# Patient Record
Sex: Male | Born: 1980 | Race: White | Hispanic: No | Marital: Married | Smoking: Never smoker
Health system: Southern US, Community
[De-identification: ages and names within clinical notes are randomized; demographics above are authoritative.]

## PROBLEM LIST (undated history)

## (undated) ENCOUNTER — Ambulatory Visit: Admission: EM | Payer: Self-pay | Source: Home / Self Care

## (undated) DIAGNOSIS — R7989 Other specified abnormal findings of blood chemistry: Secondary | ICD-10-CM

## (undated) HISTORY — DX: Other specified abnormal findings of blood chemistry: R79.89

---

## 2011-05-08 DIAGNOSIS — K219 Gastro-esophageal reflux disease without esophagitis: Secondary | ICD-10-CM | POA: Insufficient documentation

## 2012-04-09 DIAGNOSIS — E785 Hyperlipidemia, unspecified: Secondary | ICD-10-CM | POA: Insufficient documentation

## 2014-01-06 DIAGNOSIS — F988 Other specified behavioral and emotional disorders with onset usually occurring in childhood and adolescence: Secondary | ICD-10-CM | POA: Insufficient documentation

## 2019-08-10 ENCOUNTER — Encounter (HOSPITAL_BASED_OUTPATIENT_CLINIC_OR_DEPARTMENT_OTHER): Payer: Self-pay

## 2019-08-10 ENCOUNTER — Emergency Department (HOSPITAL_BASED_OUTPATIENT_CLINIC_OR_DEPARTMENT_OTHER): Payer: No Typology Code available for payment source

## 2019-08-10 ENCOUNTER — Other Ambulatory Visit: Payer: Self-pay

## 2019-08-10 ENCOUNTER — Emergency Department (HOSPITAL_BASED_OUTPATIENT_CLINIC_OR_DEPARTMENT_OTHER)
Admission: EM | Admit: 2019-08-10 | Discharge: 2019-08-10 | Disposition: A | Discharge status: Home / Self Care | Payer: No Typology Code available for payment source | Attending: Emergency Medicine | Admitting: Emergency Medicine

## 2019-08-10 DIAGNOSIS — Y9366 Activity, soccer: Secondary | ICD-10-CM | POA: Diagnosis not present

## 2019-08-10 DIAGNOSIS — X509XXA Other and unspecified overexertion or strenuous movements or postures, initial encounter: Secondary | ICD-10-CM | POA: Insufficient documentation

## 2019-08-10 DIAGNOSIS — S93401A Sprain of unspecified ligament of right ankle, initial encounter: Secondary | ICD-10-CM | POA: Insufficient documentation

## 2019-08-10 DIAGNOSIS — Y999 Unspecified external cause status: Secondary | ICD-10-CM | POA: Insufficient documentation

## 2019-08-10 DIAGNOSIS — Y92322 Soccer field as the place of occurrence of the external cause: Secondary | ICD-10-CM | POA: Insufficient documentation

## 2019-08-10 DIAGNOSIS — S93491A Sprain of other ligament of right ankle, initial encounter: Secondary | ICD-10-CM

## 2019-08-10 DIAGNOSIS — S99911A Unspecified injury of right ankle, initial encounter: Secondary | ICD-10-CM | POA: Diagnosis present

## 2019-08-10 MED ORDER — IBUPROFEN 800 MG PO TABS
800.0000 mg | ORAL_TABLET | Freq: Three times a day (TID) | ORAL | 0 refills | Status: DC
Start: 2019-08-10 — End: 2023-01-29

## 2019-08-10 NOTE — ED Provider Notes (Signed)
[facility] DEPARTMENT Provider Note   CSN: [id number] Arrival date & time: 08/10/19  1141     History Chief Complaint  Patient presents with  . Ankle Injury    Samuel Fernandez is a 39 y.o. male.  39yo M who p/w right ankle pain.  Just prior to arrival, the patient was playing soccer when he rolled his right ankle and felt a pop and immediate onset of lateral ankle pain.  He has also noticed swelling.  He denies any numbness or other injuries.  No previous injury to his ankle.  Pain is moderate and constant.  The history is provided by the patient.  Ankle Injury       History reviewed. No pertinent past medical history.  There are no problems to display for this patient.   History reviewed. No pertinent surgical history.     No family history on file.  Social History   Tobacco Use  . Smoking status: Never Smoker  . Smokeless tobacco: Never Used  Substance Use Topics  . Alcohol use: Never  . Drug use: Never    Home Medications Prior to Admission medications   Medication Sig Start Date End Date Taking? Authorizing Provider  ibuprofen (ADVIL) 800 MG tablet Take 1 tablet (800 mg total) by mouth 3 (three) times daily. 08/10/19   Chanequa Spees Rison, Wenda Overland, MD    Allergies    Patient has no known allergies.  Review of Systems   Review of Systems  Musculoskeletal: Positive for gait problem and joint swelling.  Skin: Negative for color change and wound.  Neurological: Negative for numbness.    Physical Exam Updated Vital Signs BP 113/78 (BP Location: Right Arm)   Pulse 64   Temp 98 F (36.7 C) (Oral)   Resp 20   Ht 5\' 10"  (1.778 m)   Wt 81.6 kg   SpO2 100%   BMI 25.83 kg/m   Physical Exam Vitals and nursing note reviewed.  Constitutional:      General: He is not in acute distress.    Appearance: He is well-developed.  HENT:     Head: Normocephalic and atraumatic.  Cardiovascular:     Pulses: Normal pulses.  Musculoskeletal:      General: Swelling and tenderness present.     Comments: RLE: Achilles tendon intact, no proximal fibular tenderness, no base of 5th metatarsal tenderness, no midfoot instability Swelling and tenderness of lateral malleolus and along ATFL, no ankle laxity  Skin:    General: Skin is warm and dry.     Findings: No erythema.  Neurological:     Mental Status: He is alert and oriented to person, place, and time.     Sensory: No sensory deficit.     Comments: Normal sensation b/l lower extremities  Psychiatric:        Judgment: Judgment normal.     ED Results / Procedures / Treatments   Labs (all labs ordered are listed, but only abnormal results are displayed) Labs Reviewed - No data to display  EKG None  Radiology DG Ankle Complete Right  Result Date: 08/10/2019 CLINICAL DATA:  Right ankle pain after injury playing soccer today. EXAM: RIGHT ANKLE - COMPLETE 3+ VIEW COMPARISON:  None. FINDINGS: There is no evidence of fracture, dislocation, or joint effusion. There is no evidence of arthropathy or other focal bone abnormality. Soft tissue swelling is seen over the lateral malleolus. IMPRESSION: No fracture or dislocation is noted. Soft tissue swelling is seen over lateral malleolus  suggesting ligamentous injury. Electronically Signed   By: Lupita Raider M.D.   On: 08/10/2019 12:46    Procedures Procedures (including critical care time)  Medications Ordered in ED Medications - No data to display  ED Course  I have reviewed the triage vital signs and the nursing notes.  Pertinent imaging results that were available during my care of the patient were reviewed by me and considered in my medical decision making (see chart for details).    MDM Rules/Calculators/A&P                      XR negative acute, exam c/w ankle sprain. Placed in ASO brace, pt already has crutches. Discussed ice, elevation, NSAIDs, weight bearing as tolerated, slow return to physical activity. Provided w/  sports med f/u as needed. Final Clinical Impression(s) / ED Diagnoses Final diagnoses:  Sprain of anterior talofibular ligament of right ankle, initial encounter    Rx / DC Orders ED Discharge Orders         Ordered    ibuprofen (ADVIL) 800 MG tablet  3 times daily     08/10/19 1305           Willella Harding, Ambrose Finland, MD 08/10/19 1310

## 2019-08-10 NOTE — ED Triage Notes (Signed)
Pt c/o injury to right ankle pl;aying soccer just PTA-to triage in w/c with own crutches in hand-NAD

## 2019-08-10 NOTE — ED Notes (Signed)
aso ankle splint applied to right ankle , verbal understanding

## 2022-01-01 IMAGING — CR DG ANKLE COMPLETE 3+V*R*
3 series · 3 of 3 positions shown · non-contrast
Comparison: None.

CLINICAL DATA: Right ankle pain after injury playing soccer today.

EXAM:
RIGHT ANKLE - COMPLETE 3+ VIEW

[t ankle joint ap right]
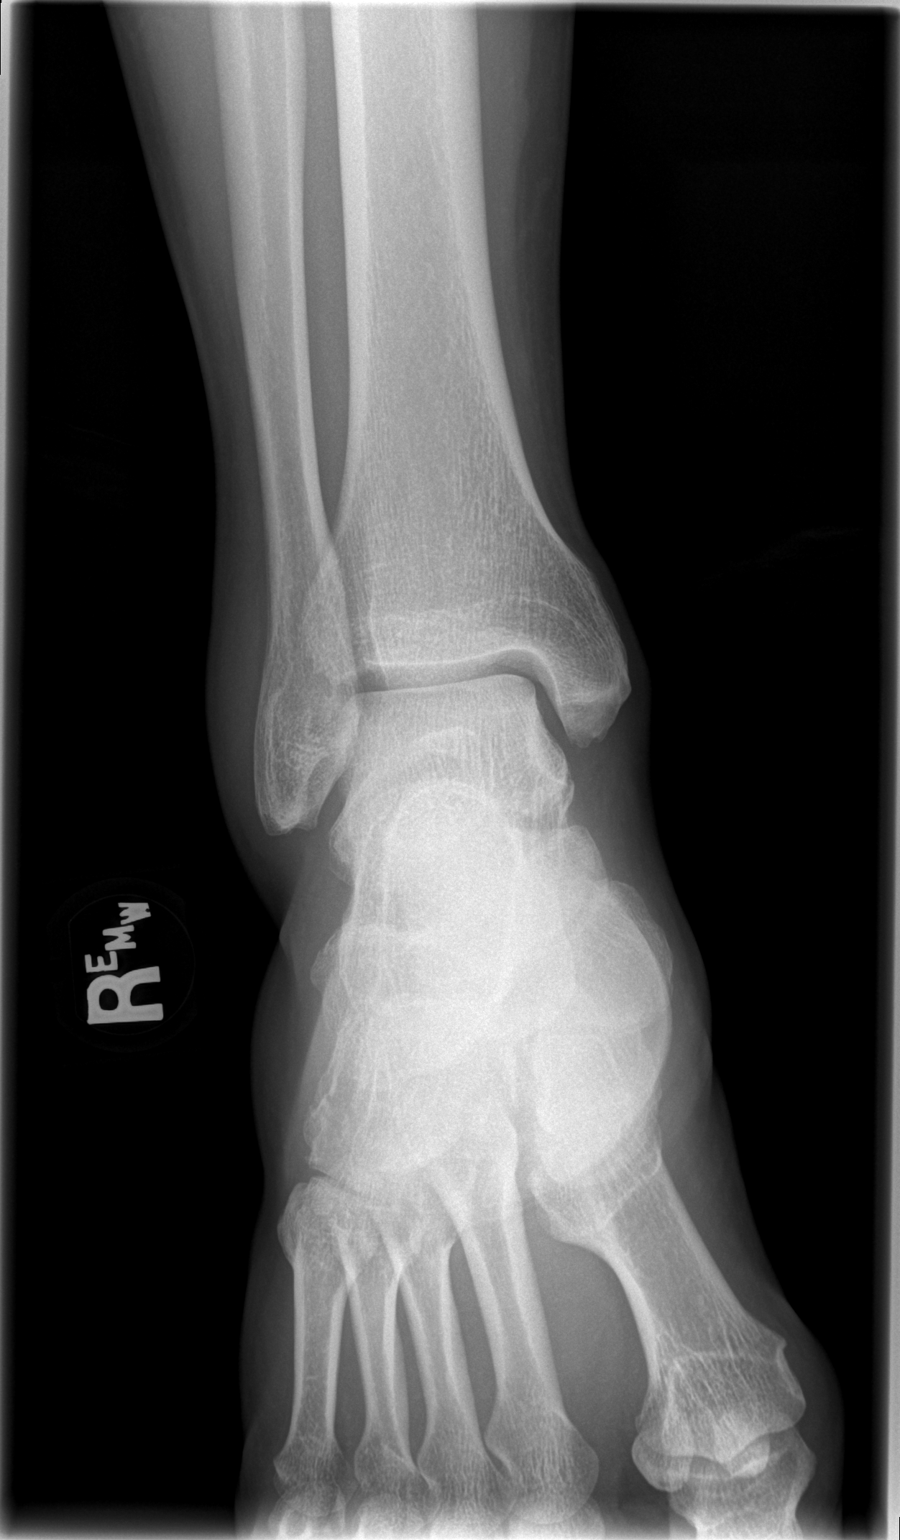

[t ankle joint oblique right]
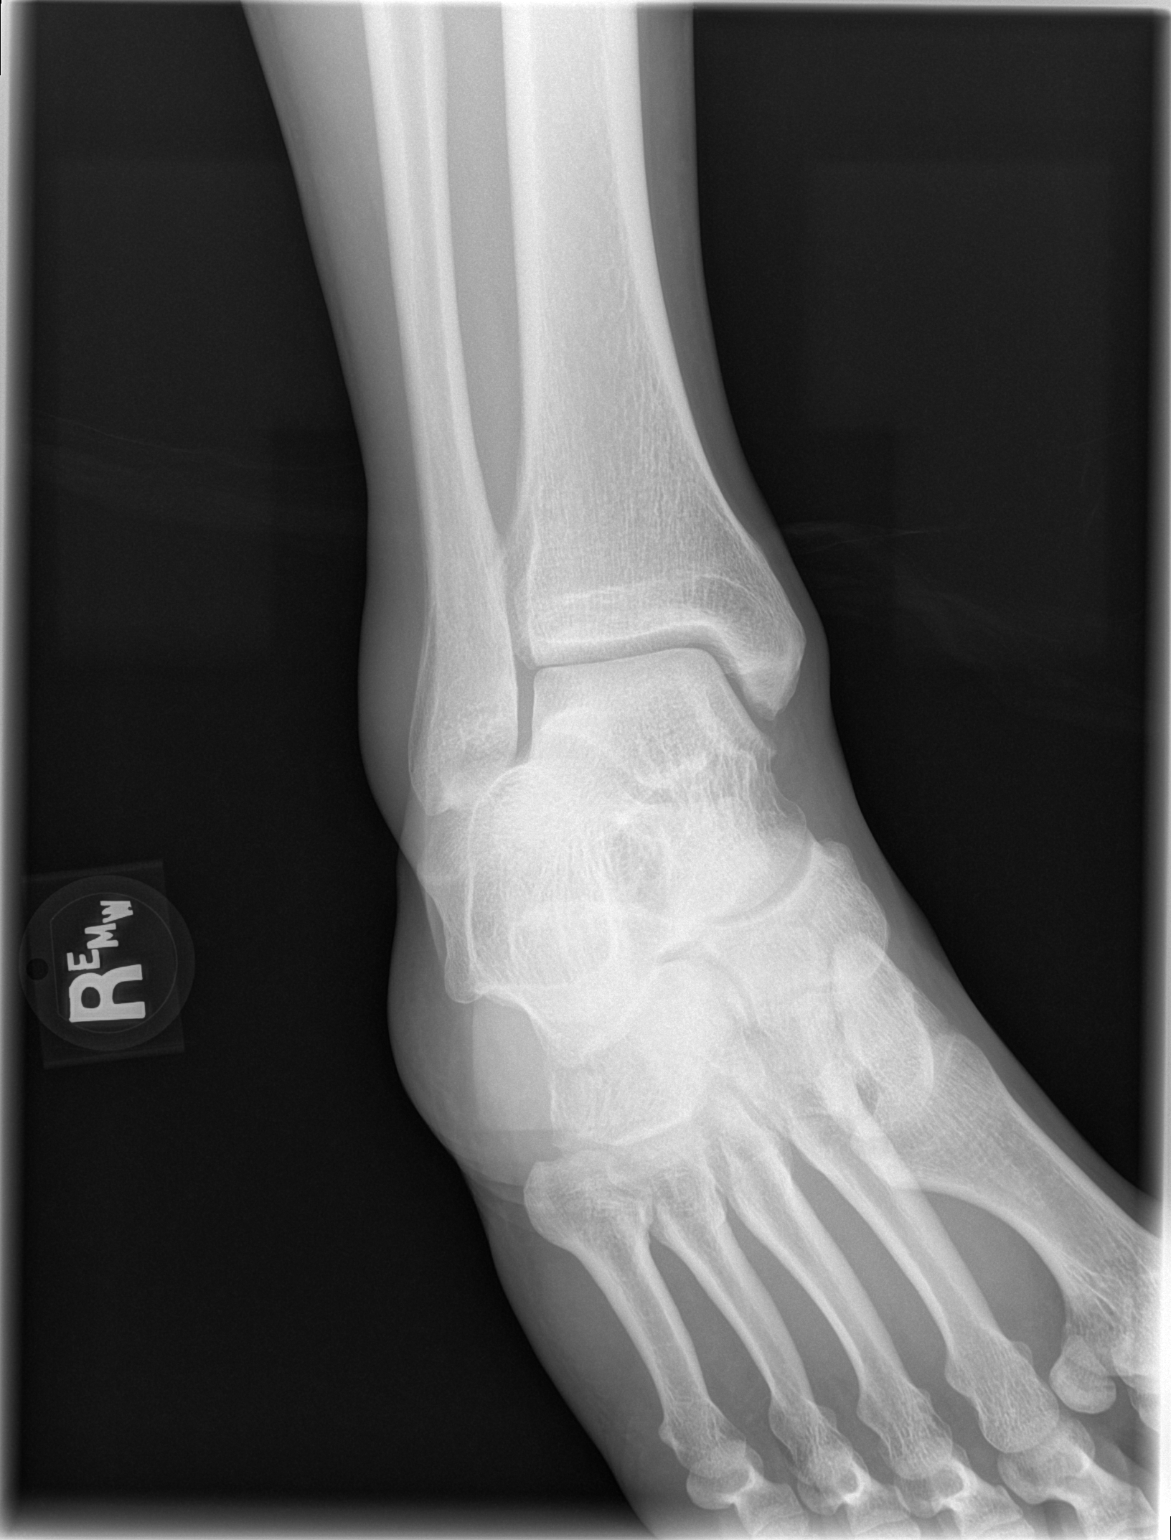

[t ankle joint lat right]
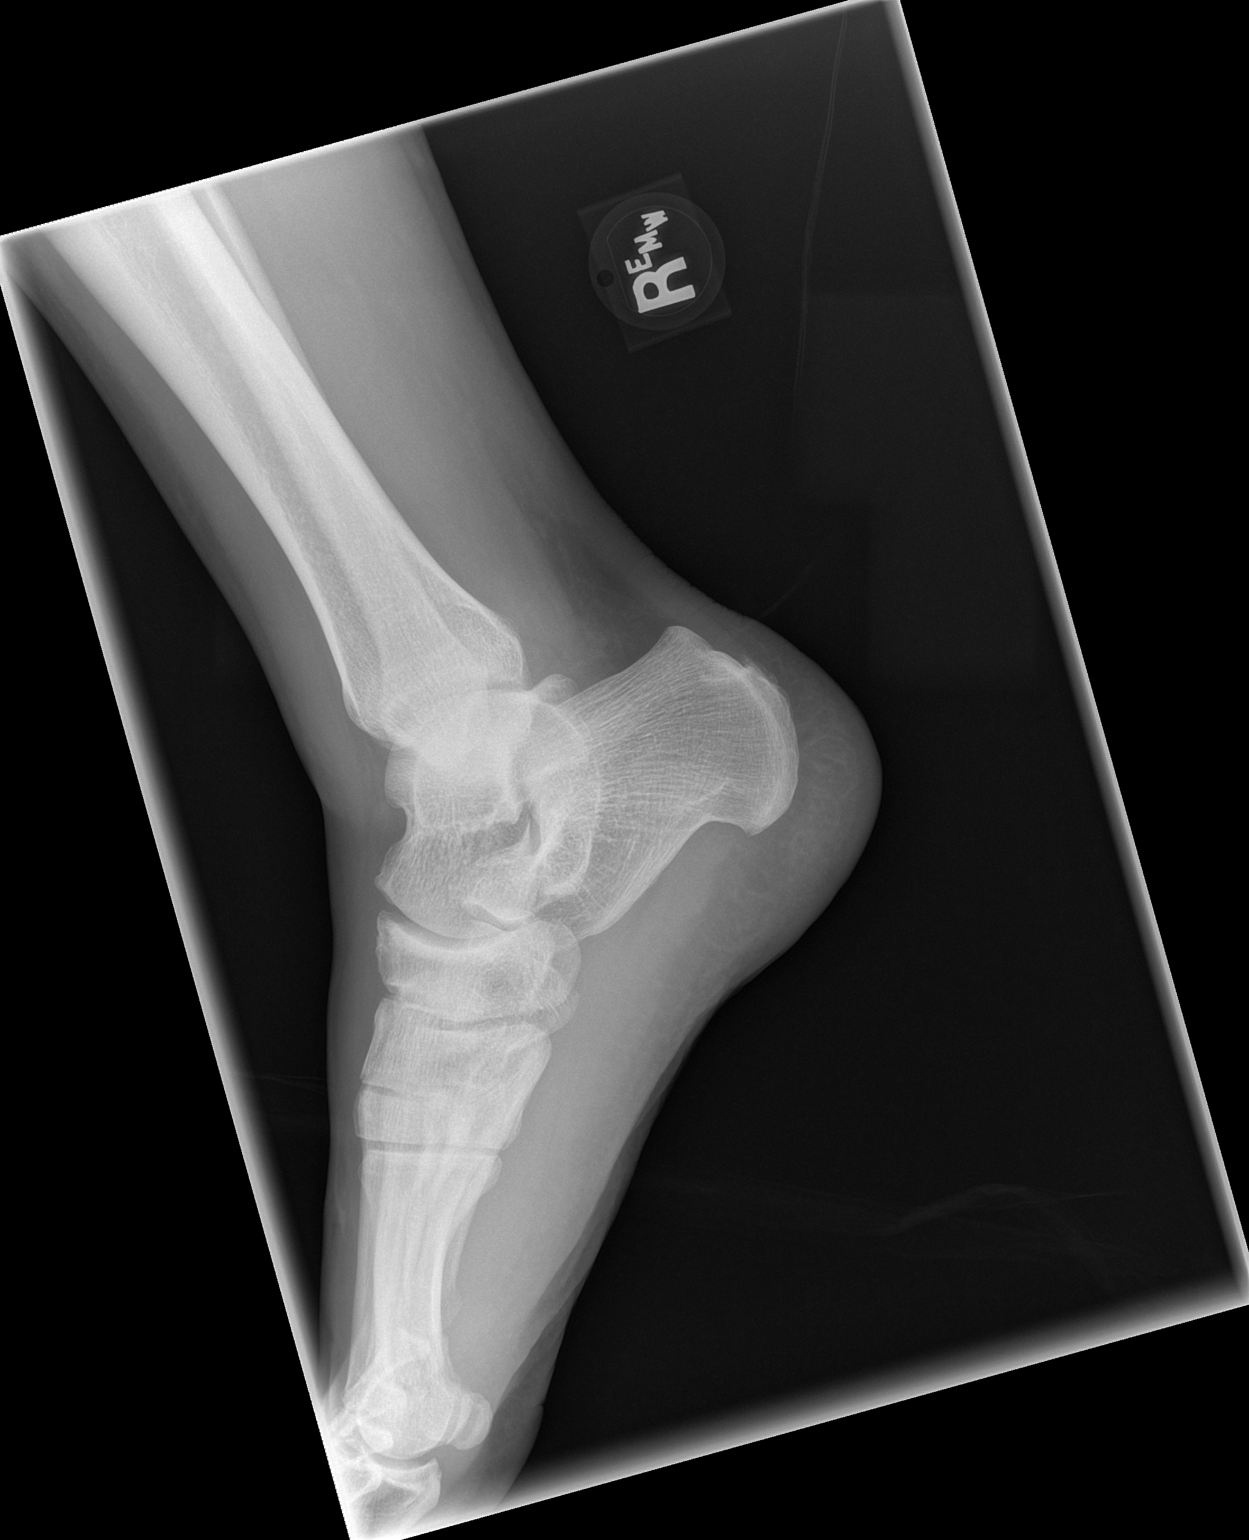

[3 of 3 positions shown; findings below may reference images not displayed]

FINDINGS: There is no evidence of fracture, dislocation, or joint effusion.
There is no evidence of arthropathy or other focal bone abnormality.
Soft tissue swelling is seen over the lateral malleolus.
IMPRESSION: No fracture or dislocation is noted. Soft tissue swelling is seen
over lateral malleolus suggesting ligamentous injury.

## 2022-10-23 DIAGNOSIS — Z0000 Encounter for general adult medical examination without abnormal findings: Secondary | ICD-10-CM | POA: Diagnosis not present

## 2022-10-23 DIAGNOSIS — E291 Testicular hypofunction: Secondary | ICD-10-CM | POA: Diagnosis not present

## 2022-10-23 DIAGNOSIS — E559 Vitamin D deficiency, unspecified: Secondary | ICD-10-CM | POA: Diagnosis not present

## 2022-10-23 DIAGNOSIS — R5383 Other fatigue: Secondary | ICD-10-CM | POA: Diagnosis not present

## 2023-01-06 DIAGNOSIS — M9901 Segmental and somatic dysfunction of cervical region: Secondary | ICD-10-CM | POA: Diagnosis not present

## 2023-01-06 DIAGNOSIS — M9903 Segmental and somatic dysfunction of lumbar region: Secondary | ICD-10-CM | POA: Diagnosis not present

## 2023-01-06 DIAGNOSIS — M5384 Other specified dorsopathies, thoracic region: Secondary | ICD-10-CM | POA: Diagnosis not present

## 2023-01-06 DIAGNOSIS — M50322 Other cervical disc degeneration at C5-C6 level: Secondary | ICD-10-CM | POA: Diagnosis not present

## 2023-01-06 DIAGNOSIS — M5386 Other specified dorsopathies, lumbar region: Secondary | ICD-10-CM | POA: Diagnosis not present

## 2023-01-06 DIAGNOSIS — M9902 Segmental and somatic dysfunction of thoracic region: Secondary | ICD-10-CM | POA: Diagnosis not present

## 2023-01-06 DIAGNOSIS — M40292 Other kyphosis, cervical region: Secondary | ICD-10-CM | POA: Diagnosis not present

## 2023-01-07 DIAGNOSIS — M5386 Other specified dorsopathies, lumbar region: Secondary | ICD-10-CM | POA: Diagnosis not present

## 2023-01-07 DIAGNOSIS — M5384 Other specified dorsopathies, thoracic region: Secondary | ICD-10-CM | POA: Diagnosis not present

## 2023-01-07 DIAGNOSIS — M9903 Segmental and somatic dysfunction of lumbar region: Secondary | ICD-10-CM | POA: Diagnosis not present

## 2023-01-07 DIAGNOSIS — M40292 Other kyphosis, cervical region: Secondary | ICD-10-CM | POA: Diagnosis not present

## 2023-01-07 DIAGNOSIS — M9901 Segmental and somatic dysfunction of cervical region: Secondary | ICD-10-CM | POA: Diagnosis not present

## 2023-01-07 DIAGNOSIS — M50322 Other cervical disc degeneration at C5-C6 level: Secondary | ICD-10-CM | POA: Diagnosis not present

## 2023-01-07 DIAGNOSIS — M9902 Segmental and somatic dysfunction of thoracic region: Secondary | ICD-10-CM | POA: Diagnosis not present

## 2023-01-09 DIAGNOSIS — M50322 Other cervical disc degeneration at C5-C6 level: Secondary | ICD-10-CM | POA: Diagnosis not present

## 2023-01-09 DIAGNOSIS — M9903 Segmental and somatic dysfunction of lumbar region: Secondary | ICD-10-CM | POA: Diagnosis not present

## 2023-01-09 DIAGNOSIS — M5386 Other specified dorsopathies, lumbar region: Secondary | ICD-10-CM | POA: Diagnosis not present

## 2023-01-09 DIAGNOSIS — M5384 Other specified dorsopathies, thoracic region: Secondary | ICD-10-CM | POA: Diagnosis not present

## 2023-01-09 DIAGNOSIS — M9901 Segmental and somatic dysfunction of cervical region: Secondary | ICD-10-CM | POA: Diagnosis not present

## 2023-01-09 DIAGNOSIS — M40292 Other kyphosis, cervical region: Secondary | ICD-10-CM | POA: Diagnosis not present

## 2023-01-09 DIAGNOSIS — M9902 Segmental and somatic dysfunction of thoracic region: Secondary | ICD-10-CM | POA: Diagnosis not present

## 2023-01-15 DIAGNOSIS — M9901 Segmental and somatic dysfunction of cervical region: Secondary | ICD-10-CM | POA: Diagnosis not present

## 2023-01-15 DIAGNOSIS — M5384 Other specified dorsopathies, thoracic region: Secondary | ICD-10-CM | POA: Diagnosis not present

## 2023-01-15 DIAGNOSIS — M50322 Other cervical disc degeneration at C5-C6 level: Secondary | ICD-10-CM | POA: Diagnosis not present

## 2023-01-15 DIAGNOSIS — M40292 Other kyphosis, cervical region: Secondary | ICD-10-CM | POA: Diagnosis not present

## 2023-01-15 DIAGNOSIS — M9902 Segmental and somatic dysfunction of thoracic region: Secondary | ICD-10-CM | POA: Diagnosis not present

## 2023-01-15 DIAGNOSIS — M9903 Segmental and somatic dysfunction of lumbar region: Secondary | ICD-10-CM | POA: Diagnosis not present

## 2023-01-15 DIAGNOSIS — M5386 Other specified dorsopathies, lumbar region: Secondary | ICD-10-CM | POA: Diagnosis not present

## 2023-01-28 ENCOUNTER — Ambulatory Visit: Payer: 59 | Admitting: Family Medicine

## 2023-01-29 DIAGNOSIS — M5386 Other specified dorsopathies, lumbar region: Secondary | ICD-10-CM | POA: Diagnosis not present

## 2023-01-29 DIAGNOSIS — M9901 Segmental and somatic dysfunction of cervical region: Secondary | ICD-10-CM | POA: Diagnosis not present

## 2023-01-29 DIAGNOSIS — M9902 Segmental and somatic dysfunction of thoracic region: Secondary | ICD-10-CM | POA: Diagnosis not present

## 2023-01-29 DIAGNOSIS — M40292 Other kyphosis, cervical region: Secondary | ICD-10-CM | POA: Diagnosis not present

## 2023-01-29 DIAGNOSIS — M50322 Other cervical disc degeneration at C5-C6 level: Secondary | ICD-10-CM | POA: Diagnosis not present

## 2023-01-29 DIAGNOSIS — M5384 Other specified dorsopathies, thoracic region: Secondary | ICD-10-CM | POA: Diagnosis not present

## 2023-01-29 DIAGNOSIS — M9903 Segmental and somatic dysfunction of lumbar region: Secondary | ICD-10-CM | POA: Diagnosis not present

## 2023-01-30 ENCOUNTER — Ambulatory Visit: Payer: 59 | Admitting: Family Medicine

## 2023-01-30 DIAGNOSIS — M50322 Other cervical disc degeneration at C5-C6 level: Secondary | ICD-10-CM | POA: Diagnosis not present

## 2023-01-30 DIAGNOSIS — M40292 Other kyphosis, cervical region: Secondary | ICD-10-CM | POA: Diagnosis not present

## 2023-01-30 DIAGNOSIS — M9901 Segmental and somatic dysfunction of cervical region: Secondary | ICD-10-CM | POA: Diagnosis not present

## 2023-01-30 DIAGNOSIS — M9903 Segmental and somatic dysfunction of lumbar region: Secondary | ICD-10-CM | POA: Diagnosis not present

## 2023-01-30 DIAGNOSIS — M5386 Other specified dorsopathies, lumbar region: Secondary | ICD-10-CM | POA: Diagnosis not present

## 2023-01-30 DIAGNOSIS — M5384 Other specified dorsopathies, thoracic region: Secondary | ICD-10-CM | POA: Diagnosis not present

## 2023-01-30 DIAGNOSIS — M9902 Segmental and somatic dysfunction of thoracic region: Secondary | ICD-10-CM | POA: Diagnosis not present

## 2023-02-13 ENCOUNTER — Ambulatory Visit (INDEPENDENT_AMBULATORY_CARE_PROVIDER_SITE_OTHER): Payer: 59 | Admitting: Orthopedic Surgery

## 2023-02-13 ENCOUNTER — Encounter: Payer: Self-pay | Admitting: Orthopedic Surgery

## 2023-02-13 VITALS — BP 122/82 | HR 77 | Temp 97.0°F | Resp 16 | Ht 70.0 in | Wt 199.4 lb

## 2023-02-13 DIAGNOSIS — G4733 Obstructive sleep apnea (adult) (pediatric): Secondary | ICD-10-CM | POA: Diagnosis not present

## 2023-02-13 DIAGNOSIS — R7989 Other specified abnormal findings of blood chemistry: Secondary | ICD-10-CM

## 2023-02-13 DIAGNOSIS — K219 Gastro-esophageal reflux disease without esophagitis: Secondary | ICD-10-CM | POA: Diagnosis not present

## 2023-02-13 DIAGNOSIS — J3089 Other allergic rhinitis: Secondary | ICD-10-CM

## 2023-02-13 DIAGNOSIS — E785 Hyperlipidemia, unspecified: Secondary | ICD-10-CM

## 2023-02-13 MED ORDER — TESTOSTERONE CYPIONATE 200 MG/ML IM SOLN: 200.0000 mg | INTRAMUSCULAR | 0 refills | Status: AC

## 2023-02-13 NOTE — Patient Instructions (Signed)
Xyzal allergy medication- take one tablet at night x 14 days> then you may go back to Claritin medication after  Saline nasal spray > do not spray to brain> get sides on nose> may use as many times daily   Consider air purifier at night

## 2023-02-13 NOTE — Progress Notes (Addendum)
Careteam: Patient Care Team: Octavia Heir, NP as PCP - General (Adult Health Nurse Practitioner)  Seen by: Hazle Nordmann, AGNP-C  PLACE OF SERVICE:  [facility]  Advanced Directive information    No Known Allergies  Chief Complaint  Patient presents with   Establish Care    New patient.      HPI: Patient is a 42 y.o. male seen today to establish at [facility].   Previous provider with [facility], Ryna Hyacinth Meeker PA. Moved to [place] in 2003. Married. 4 children. 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   Denies MI, T2DM, cancers.   GERD- past EGD, remains on omeprazole, food triggers are cheese  OSA- reports minor case, stopped using CPAP a few months back, admits to some daytime sleepiness  HLD- LDL 104 11/14/2017, not on medication  Low testosterone- decreased libido a few months back, erection also changed> " not as hard", testosterone level decreased> confired by lab at [facility], last testosterone injection one month ago, requesting level be rechecked today  Allergies- worsened when he moved to [place], remains on Claritin  No past surgeries.   EGD/colonoscopy 04/25/2022.   No hospitalizations.   Family history:   Father aged 13, lung cancer  Mother aged 26, depression  Maternal grandfather> passed 80's> MI  Maternal grandmother> passed 80's> MI  Paternal grandfather> passed 70's> lung cancer  Paternal grandmother> passed 70's> CKD  Smoking: began at age 61, averaged 1PPD, quit 2016 at age 61 Alcohol: no current use or abuse Illicit drugs: no past use or abuse  Diet: eats 3 meals daily, occasional soda, most meals with lean protein and vegetables Exercises 3x/weekly  No advanced directives.    Review of Systems:  Review of Systems  Constitutional: Negative.   HENT: Negative.    Eyes: Negative.   Respiratory: Negative.    Cardiovascular: Negative.   Gastrointestinal: Negative.   Genitourinary: Negative.   Musculoskeletal: Negative.   Skin:  Negative.   Neurological: Negative.   Endo/Heme/Allergies:  Positive for environmental allergies.  Psychiatric/Behavioral: Negative.      No past medical history on file. No past surgical history on file. Social History:   reports that he has never smoked. He has never used smokeless tobacco. He reports that he does not drink alcohol and does not use drugs.  No family history on file.  Medications: Patient's Medications  New Prescriptions   No medications on file  Previous Medications   OMEPRAZOLE (PRILOSEC) 20 MG CAPSULE    Take 20 mg by mouth daily.   TESTOSTERONE CYPIONATE (DEPOTESTOSTERONE CYPIONATE) 200 MG/ML INJECTION    Inject 200 mg into the muscle every 14 (fourteen) days.  Modified Medications   No medications on file  Discontinued Medications   No medications on file    Physical Exam:  There were no vitals filed for this visit. There is no height or weight on file to calculate BMI. Wt Readings from Last 3 Encounters:  08/10/19 180 lb (81.6 kg)    Physical Exam Vitals reviewed.  Constitutional:      General: He is not in acute distress. HENT:     Head: Normocephalic.     Nose:     Right Turbinates: Enlarged.     Left Turbinates: Enlarged.  Eyes:     General:        Right eye: No discharge.        Left eye: No discharge.  Cardiovascular:     Rate and Rhythm: Normal  rate and regular rhythm.     Pulses: Normal pulses.     Heart sounds: Normal heart sounds.  Pulmonary:     Effort: Pulmonary effort is normal. No respiratory distress.     Breath sounds: Normal breath sounds. No wheezing or rales.  Abdominal:     Palpations: Abdomen is soft.  Musculoskeletal:        General: Normal range of motion.     Cervical back: Neck supple.  Skin:    General: Skin is warm.     Capillary Refill: Capillary refill takes less than 2 seconds.  Neurological:     General: No focal deficit present.     Mental Status: He is alert and oriented to person, place, and time.   Psychiatric:        Mood and Affect: Mood normal.     Labs reviewed: Basic Metabolic Panel: No results for input(s): "NA", "K", "CL", "CO2", "GLUCOSE", "BUN", "CREATININE", "CALCIUM", "MG", "PHOS", "TSH" in the last 8760 hours. Liver Function Tests: No results for input(s): "AST", "ALT", "ALKPHOS", "BILITOT", "PROT", "ALBUMIN" in the last 8760 hours. No results for input(s): "LIPASE", "AMYLASE" in the last 8760 hours. No results for input(s): "AMMONIA" in the last 8760 hours. CBC: No results for input(s): "WBC", "NEUTROABS", "HGB", "HCT", "MCV", "PLT" in the last 8760 hours. Lipid Panel: No results for input(s): "CHOL", "HDL", "LDLCALC", "TRIG", "CHOLHDL", "LDLDIRECT" in the last 8760 hours. TSH: No results for input(s): "TSH" in the last 8760 hours. A1C: No results found for: "HGBA1C"   Assessment/Plan 1. Low testosterone in male - decreased libido a few months back - testosterone level decreased> medical records requested from [facility] - he has been off medication x 1 month - repeat levels today - consider urology referral to discuss libido/erection issues - Testosterone - testosterone cypionate (DEPOTESTOSTERONE CYPIONATE) 200 MG/ML injection; Inject 1 mL (200 mg total) into the muscle every 14 (fourteen) days.  Dispense: 10 mL; Refill: 0  2. Gastroesophageal reflux disease without esophagitis - evaluated by GI in past - 04/25/2022 EGD> unable to review results - cont omeprazole  3. OSA (obstructive sleep apnea) - has CPAP but not using  - discussed advantages of restarting  4. Hyperlipidemia LDL goal <100 - LDL 104 11/15/2022 - plan fasting lipid panel with physical  5. Environmental and seasonal allergies - nasal turbinates swollen - discussed trying Xyzal - recommend saline nasal spray - may need ENT referral in future  Future tests/labs: discuss vaccinations> especially Tdap, basic blood work> cbc/diff, cmp, lipid panel, HIV/HepC  Total time: 35 minutes.  Greater than 50% of total time spent doing patient education regarding GERD, OSA, low testosterone and allergies including symptom/medication management.    Next appt: 05/22/2023  Hazle Nordmann, Juel Burrow  [facility] [phone]

## 2023-02-14 ENCOUNTER — Other Ambulatory Visit: Payer: Self-pay | Admitting: Orthopedic Surgery

## 2023-02-14 DIAGNOSIS — R7989 Other specified abnormal findings of blood chemistry: Secondary | ICD-10-CM

## 2023-02-14 LAB — TESTOSTERONE: Testosterone: 414 ng/dL (ref 250–827)

## 2023-02-17 ENCOUNTER — Encounter: Payer: Self-pay | Admitting: Orthopedic Surgery

## 2023-02-17 NOTE — Telephone Encounter (Signed)
Duplicate

## 2023-02-28 ENCOUNTER — Other Ambulatory Visit (HOSPITAL_BASED_OUTPATIENT_CLINIC_OR_DEPARTMENT_OTHER): Payer: Self-pay

## 2023-03-03 NOTE — Telephone Encounter (Signed)
Insurance Denied covering medication. Please Advise.

## 2023-03-03 NOTE — Telephone Encounter (Signed)
Per Hazle Nordmann, NP : "Can you let him know any paperwork need to be sent to office. In all honesty, I will not be able to get medication approved with one abnormal lab value. "  MyChart Message sent to patient.

## 2023-04-09 ENCOUNTER — Telehealth: Payer: Self-pay

## 2023-04-09 NOTE — Telephone Encounter (Signed)
 [employer], notice of adverse decision received fro provider to review- see media tab

## 2023-04-09 NOTE — Telephone Encounter (Signed)
 Incoming fax received from patients pharmacy to initiate a prior authorization for                   .  PA initiated through covermymeds. Key: [id number]  Awaiting reply from the insurance company which will be determined in 48-72 hours.

## 2023-04-09 NOTE — Telephone Encounter (Signed)
 Since medication was denied. Recommend referral to [facility] for decreased libido.

## 2023-05-22 ENCOUNTER — Encounter: Payer: 59 | Admitting: Orthopedic Surgery

## 2023-05-23 NOTE — Progress Notes (Signed)
 This encounter was created in error - please disregard.

## 2023-05-29 DIAGNOSIS — M5386 Other specified dorsopathies, lumbar region: Secondary | ICD-10-CM | POA: Diagnosis not present

## 2023-05-29 DIAGNOSIS — M9901 Segmental and somatic dysfunction of cervical region: Secondary | ICD-10-CM | POA: Diagnosis not present

## 2023-05-29 DIAGNOSIS — M9903 Segmental and somatic dysfunction of lumbar region: Secondary | ICD-10-CM | POA: Diagnosis not present

## 2023-05-29 DIAGNOSIS — M50322 Other cervical disc degeneration at C5-C6 level: Secondary | ICD-10-CM | POA: Diagnosis not present

## 2023-06-03 DIAGNOSIS — M9901 Segmental and somatic dysfunction of cervical region: Secondary | ICD-10-CM | POA: Diagnosis not present

## 2023-06-03 DIAGNOSIS — M50322 Other cervical disc degeneration at C5-C6 level: Secondary | ICD-10-CM | POA: Diagnosis not present

## 2023-06-03 DIAGNOSIS — M5386 Other specified dorsopathies, lumbar region: Secondary | ICD-10-CM | POA: Diagnosis not present

## 2023-06-03 DIAGNOSIS — M9903 Segmental and somatic dysfunction of lumbar region: Secondary | ICD-10-CM | POA: Diagnosis not present
